# Patient Record
Sex: Female | Born: 1980 | Race: Black or African American | Hispanic: No | Marital: Married | Smoking: Current every day smoker
Health system: Southern US, Community
[De-identification: ages and names within clinical notes are randomized; demographics above are authoritative.]

## PROBLEM LIST (undated history)

## (undated) DIAGNOSIS — M797 Fibromyalgia: Secondary | ICD-10-CM

## (undated) DIAGNOSIS — M1990 Unspecified osteoarthritis, unspecified site: Secondary | ICD-10-CM

## (undated) DIAGNOSIS — N809 Endometriosis, unspecified: Secondary | ICD-10-CM

## (undated) DIAGNOSIS — F329 Major depressive disorder, single episode, unspecified: Secondary | ICD-10-CM

## (undated) DIAGNOSIS — M7071 Other bursitis of hip, right hip: Secondary | ICD-10-CM

## (undated) DIAGNOSIS — F4310 Post-traumatic stress disorder, unspecified: Secondary | ICD-10-CM

## (undated) DIAGNOSIS — I10 Essential (primary) hypertension: Secondary | ICD-10-CM

## (undated) DIAGNOSIS — J42 Unspecified chronic bronchitis: Secondary | ICD-10-CM

## (undated) DIAGNOSIS — M7072 Other bursitis of hip, left hip: Secondary | ICD-10-CM

## (undated) DIAGNOSIS — G5603 Carpal tunnel syndrome, bilateral upper limbs: Secondary | ICD-10-CM

## (undated) DIAGNOSIS — F32A Depression, unspecified: Secondary | ICD-10-CM

## (undated) HISTORY — DX: Major depressive disorder, single episode, unspecified: F32.9

## (undated) HISTORY — DX: Other bursitis of hip, right hip: M70.72

## (undated) HISTORY — DX: Other bursitis of hip, right hip: M70.71

## (undated) HISTORY — DX: Carpal tunnel syndrome, bilateral upper limbs: G56.03

## (undated) HISTORY — DX: Endometriosis, unspecified: N80.9

## (undated) HISTORY — DX: Depression, unspecified: F32.A

## (undated) HISTORY — DX: Unspecified chronic bronchitis: J42

## (undated) HISTORY — DX: Post-traumatic stress disorder, unspecified: F43.10

## (undated) HISTORY — DX: Unspecified osteoarthritis, unspecified site: M19.90

---

## 2004-06-28 ENCOUNTER — Emergency Department: Payer: Self-pay | Admitting: Emergency Medicine

## 2005-06-19 ENCOUNTER — Emergency Department: Payer: Self-pay | Admitting: Emergency Medicine

## 2005-06-20 ENCOUNTER — Emergency Department: Payer: Self-pay | Admitting: Emergency Medicine

## 2005-08-22 ENCOUNTER — Emergency Department: Payer: Self-pay | Admitting: Emergency Medicine

## 2005-09-10 ENCOUNTER — Other Ambulatory Visit: Payer: Self-pay

## 2005-09-10 ENCOUNTER — Emergency Department: Payer: Self-pay | Admitting: Unknown Physician Specialty

## 2006-01-19 ENCOUNTER — Emergency Department: Payer: Self-pay | Admitting: Emergency Medicine

## 2006-04-12 ENCOUNTER — Emergency Department: Payer: Self-pay

## 2006-05-16 ENCOUNTER — Emergency Department: Payer: Self-pay | Admitting: Emergency Medicine

## 2006-06-30 ENCOUNTER — Emergency Department: Payer: Self-pay | Admitting: Internal Medicine

## 2006-08-20 ENCOUNTER — Observation Stay: Payer: Self-pay | Admitting: Obstetrics and Gynecology

## 2006-08-30 ENCOUNTER — Emergency Department: Payer: Self-pay | Admitting: General Practice

## 2006-10-15 ENCOUNTER — Encounter: Payer: Self-pay | Admitting: Family Medicine

## 2006-10-20 ENCOUNTER — Observation Stay: Payer: Self-pay

## 2006-10-22 ENCOUNTER — Inpatient Hospital Stay: Payer: Self-pay

## 2006-10-29 ENCOUNTER — Inpatient Hospital Stay: Payer: Self-pay

## 2006-11-09 ENCOUNTER — Emergency Department: Payer: Self-pay | Admitting: General Practice

## 2006-11-11 ENCOUNTER — Encounter: Payer: Self-pay | Admitting: Family Medicine

## 2007-07-01 ENCOUNTER — Emergency Department: Payer: Self-pay | Admitting: Emergency Medicine

## 2007-08-18 ENCOUNTER — Emergency Department (HOSPITAL_COMMUNITY): Admission: EM | Admit: 2007-08-18 | Discharge: 2007-08-18 | Payer: Self-pay | Admitting: Emergency Medicine

## 2007-09-16 ENCOUNTER — Emergency Department: Payer: Self-pay | Admitting: Emergency Medicine

## 2007-10-09 ENCOUNTER — Emergency Department: Payer: Self-pay | Admitting: Internal Medicine

## 2008-01-17 ENCOUNTER — Emergency Department: Payer: Self-pay | Admitting: Emergency Medicine

## 2008-06-01 ENCOUNTER — Emergency Department (HOSPITAL_COMMUNITY): Admission: EM | Admit: 2008-06-01 | Discharge: 2008-06-01 | Payer: Self-pay | Admitting: Emergency Medicine

## 2008-12-05 ENCOUNTER — Emergency Department: Payer: Self-pay | Admitting: Emergency Medicine

## 2011-03-17 LAB — URINE MICROSCOPIC-ADD ON

## 2011-03-17 LAB — URINALYSIS, ROUTINE W REFLEX MICROSCOPIC
Bilirubin Urine: NEGATIVE
Nitrite: NEGATIVE
Specific Gravity, Urine: 1.015 (ref 1.005–1.030)
Urobilinogen, UA: 0.2 mg/dL (ref 0.0–1.0)
pH: 5.5 (ref 5.0–8.0)

## 2011-03-17 LAB — POCT PREGNANCY, URINE: Preg Test, Ur: NEGATIVE

## 2011-04-17 ENCOUNTER — Emergency Department: Payer: Self-pay | Admitting: Emergency Medicine

## 2011-05-13 ENCOUNTER — Emergency Department: Payer: Self-pay | Admitting: Emergency Medicine

## 2011-08-10 ENCOUNTER — Ambulatory Visit: Payer: Self-pay | Admitting: Family Medicine

## 2011-09-03 ENCOUNTER — Emergency Department: Payer: Self-pay | Admitting: Internal Medicine

## 2011-09-03 LAB — URINALYSIS, COMPLETE
Bilirubin,UR: NEGATIVE
Blood: NEGATIVE
Squamous Epithelial: 5

## 2011-09-03 LAB — COMPREHENSIVE METABOLIC PANEL
BUN: 8 mg/dL (ref 7–18)
Calcium, Total: 9 mg/dL (ref 8.5–10.1)
Chloride: 106 mmol/L (ref 98–107)
EGFR (African American): >60
Osmolality: 280 (ref 275–301)
Potassium: 3.7 mmol/L (ref 3.5–5.1)
SGOT(AST): 13 U/L — ABNORMAL LOW (ref 15–37)
SGPT (ALT): 17 U/L
Sodium: 142 mmol/L (ref 136–145)

## 2011-09-03 LAB — MAGNESIUM: Magnesium: 1.6 mg/dL — ABNORMAL LOW

## 2011-09-03 LAB — CBC
HGB: 13.8 g/dL (ref 12.0–16.0)
MCH: 31.6 pg (ref 26.0–34.0)
MCHC: 33.5 g/dL (ref 32.0–36.0)
MCV: 94 fL (ref 80–100)
Platelet: 183 10*3/uL (ref 150–440)
RDW: 14.8 % — ABNORMAL HIGH (ref 11.5–14.5)
WBC: 5.1 10*3/uL (ref 3.6–11.0)

## 2011-09-03 LAB — LIPASE, BLOOD: Lipase: 153 U/L (ref 73–393)

## 2011-09-09 LAB — CULTURE, BLOOD (SINGLE)

## 2011-11-09 ENCOUNTER — Emergency Department: Payer: Self-pay | Admitting: Emergency Medicine

## 2013-02-18 ENCOUNTER — Emergency Department: Payer: Self-pay | Admitting: Emergency Medicine

## 2013-06-22 ENCOUNTER — Emergency Department: Payer: Self-pay | Admitting: Emergency Medicine

## 2013-10-31 ENCOUNTER — Emergency Department: Payer: Self-pay | Admitting: Emergency Medicine

## 2013-11-02 LAB — BETA STREP CULTURE(ARMC)

## 2014-11-23 ENCOUNTER — Emergency Department
Admission: EM | Admit: 2014-11-23 | Discharge: 2014-11-23 | Disposition: A | Discharge status: Home / Self Care | Payer: Commercial Managed Care - PPO | Attending: Emergency Medicine | Admitting: Emergency Medicine

## 2014-11-23 ENCOUNTER — Encounter: Payer: Self-pay | Admitting: Emergency Medicine

## 2014-11-23 DIAGNOSIS — L03114 Cellulitis of left upper limb: Secondary | ICD-10-CM

## 2014-11-23 DIAGNOSIS — Z720 Tobacco use: Secondary | ICD-10-CM | POA: Diagnosis not present

## 2014-11-23 DIAGNOSIS — E876 Hypokalemia: Secondary | ICD-10-CM | POA: Diagnosis not present

## 2014-11-23 DIAGNOSIS — R2232 Localized swelling, mass and lump, left upper limb: Secondary | ICD-10-CM | POA: Diagnosis present

## 2014-11-23 DIAGNOSIS — I10 Essential (primary) hypertension: Secondary | ICD-10-CM | POA: Insufficient documentation

## 2014-11-23 HISTORY — DX: Essential (primary) hypertension: I10

## 2014-11-23 LAB — CBC WITH DIFFERENTIAL/PLATELET
BASOS ABS: 0 10*3/uL (ref 0–0.1)
Basophils Relative: 1 %
EOS PCT: 6 %
Eosinophils Absolute: 0.3 10*3/uL (ref 0–0.7)
HCT: 39.8 % (ref 35.0–47.0)
Hemoglobin: 13 g/dL (ref 12.0–16.0)
LYMPHS ABS: 2 10*3/uL (ref 1.0–3.6)
LYMPHS PCT: 34 %
MCH: 30.1 pg (ref 26.0–34.0)
MCHC: 32.6 g/dL (ref 32.0–36.0)
MCV: 92.2 fL (ref 80.0–100.0)
Monocytes Absolute: 0.5 10*3/uL (ref 0.2–0.9)
Monocytes Relative: 9 %
NEUTROS ABS: 3 10*3/uL (ref 1.4–6.5)
NEUTROS PCT: 50 %
PLATELETS: 212 10*3/uL (ref 150–440)
RBC: 4.32 MIL/uL (ref 3.80–5.20)
RDW: 15.4 % — AB (ref 11.5–14.5)
WBC: 5.9 10*3/uL (ref 3.6–11.0)

## 2014-11-23 LAB — BASIC METABOLIC PANEL
Anion gap: 6 (ref 5–15)
BUN: 10 mg/dL (ref 6–20)
CALCIUM: 8.9 mg/dL (ref 8.9–10.3)
CO2: 25 mmol/L (ref 22–32)
CREATININE: 1.06 mg/dL — AB (ref 0.44–1.00)
Chloride: 107 mmol/L (ref 101–111)
GFR calc Af Amer: >60 mL/min (ref >60)
GLUCOSE: 85 mg/dL (ref 65–99)
Potassium: 3.4 mmol/L — ABNORMAL LOW (ref 3.5–5.1)
SODIUM: 138 mmol/L (ref 135–145)

## 2014-11-23 MED ORDER — CLINDAMYCIN HCL 150 MG PO CAPS: ORAL_CAPSULE | ORAL | Status: DC

## 2014-11-23 MED ORDER — CLINDAMYCIN PHOSPHATE 600 MG/50ML IV SOLN
INTRAVENOUS | Status: AC
  Administered 2014-11-23: 600 mg via INTRAVENOUS
  Filled 2014-11-23: qty 50

## 2014-11-23 MED ORDER — EPINEPHRINE HCL 1 MG/ML IJ SOLN
INTRAMUSCULAR | Status: AC
  Filled 2014-11-23: qty 1

## 2014-11-23 MED ORDER — METHYLPREDNISOLONE SODIUM SUCC 125 MG IJ SOLR
INTRAMUSCULAR | Status: AC
  Filled 2014-11-23: qty 2

## 2014-11-23 MED ORDER — CLINDAMYCIN PHOSPHATE 600 MG/50ML IV SOLN
600.0000 mg | Freq: Once | INTRAVENOUS | Status: AC
  Administered 2014-11-23: 600 mg via INTRAVENOUS

## 2014-11-23 MED ORDER — HYDROCODONE-ACETAMINOPHEN 5-325 MG PO TABS: 1.0000 | ORAL_TABLET | ORAL | Status: DC | PRN

## 2014-11-23 NOTE — ED Notes (Signed)
itchy

## 2014-11-23 NOTE — ED Notes (Signed)
Has swelling to left humerus, has some insect bites, noticed yesterday

## 2014-11-23 NOTE — ED Provider Notes (Signed)
[facility] Emergency Department Provider Note  ____________________________________________  Time seen: 1528  I have reviewed the triage vital signs and the nursing notes.   HISTORY  Chief Complaint Arm Swelling   HPI Mrytle TAJ ARTEAGA is a 34 y.o. female is here today with complaint of left arm swelling. She is not aware of any bugs biting her but noted that there was some itching, swelling and tenderness to her left arm yesterday. The swelling and redness has increased today. She is unaware of any fever at home, positive for some nausea but no vomiting.   Past Medical History  Diagnosis Date  . Hypertension     There are no active problems to display for this patient.   Past Surgical History  Procedure Laterality Date  . Cesarean section      Current Outpatient Rx  Name  Route  Sig  Dispense  Refill  . clindamycin (CLEOCIN) 150 MG capsule      2 caps every 6 hours until finished   56 capsule   0   . HYDROcodone-acetaminophen (NORCO/VICODIN) 5-325 MG per tablet   Oral   Take 1 tablet by mouth every 4 (four) hours as needed for moderate pain.   20 tablet   0     Allergies Amoxicillin  No family history on file.  Social History History  Substance Use Topics  . Smoking status: Current Every Day Smoker -- 0.50 packs/day    Types: Cigarettes  . Smokeless tobacco: Not on file  . Alcohol Use: No    Review of Systems Constitutional: No fever/chills ENT: No sore throat. Cardiovascular: Denies chest pain. Respiratory: Denies shortness of breath. Gastrointestinal: No abdominal pain.  No nausea, no vomiting.   Genitourinary: Negative for dysuria. Musculoskeletal: Negative for back pain. Skin: Negative for rash. Neurological: Negative for headaches, focal weakness or numbness.  10-point ROS otherwise negative.  ____________________________________________   PHYSICAL EXAM:  VITAL SIGNS: ED Triage Vitals  Enc Vitals Group     BP  --      Pulse --      Resp --      Temp --      Temp src --      SpO2 --      Weight --      Height --      Head Cir --      Peak Flow --      Pain Score 11/23/14 1455 6     Pain Loc --      Pain Edu? --      Excl. in [place]? --     Constitutional: Alert and oriented. Well appearing and in no acute distress. Eyes: Conjunctivae are normal. PERRL. EOMI. Head: Atraumatic. Nose: No congestion/rhinnorhea. Neck: No stridor.   Cardiovascular: Normal rate, regular rhythm. Grossly normal heart sounds.  Good peripheral circulation. Respiratory: Normal respiratory effort.  No retractions. Lungs CTAB. Gastrointestinal: Soft and nontender. No distention. Musculoskeletal: No lower extremity tenderness nor edema.  No joint effusions. Range of motion left arm is without restrictions. Neurologic:  Normal speech and language. No gross focal neurologic deficits are appreciated. Speech is normal. No gait instability. Skin:  Skin is warm, dry and intact. Left arm lateral aspect with a 4-1/2-5 cm erythematous area with warmth approximately 2 cm surrounding this. It is tender to touch. There is no drainage from this area. Sooner this area is extremely hard with no fluctuance on palpation in the center. Psychiatric: Mood and affect are  normal. Speech and behavior are normal.  ____________________________________________   LABS (all labs ordered are listed, but only abnormal results are displayed)  Labs Reviewed  CBC WITH DIFFERENTIAL/PLATELET - Abnormal; Notable for the following:    RDW 15.4 (*)    All other components within normal limits  BASIC METABOLIC PANEL - Abnormal; Notable for the following:    Potassium 3.4 (*)    Creatinine, Ser 1.06 (*)    All other components within normal limits  PROCEDURES  Procedure(s) performed: None  Critical Care performed: No  ____________________________________________   INITIAL IMPRESSION / ASSESSMENT AND PLAN / ED COURSE  Pertinent labs & imaging  results that were available during my care of the patient were reviewed by me and considered in my medical decision making (see chart for details).  Patient is slightly hypokalemic. Patient states she's had a history of low potassium at approximately 8 years ago. She is currently a patient at [facility] and will follow up there. She was started on antibiotic's and told to return to the emergency room should this area become much worse, unable to take antibiotic's or high fever ____________________________________________   FINAL CLINICAL IMPRESSION(S) / ED DIAGNOSES  Final diagnoses:  Cellulitis of arm, left  Hypokalemia      Johnn Hai, PA-C 11/23/14 1803  Delman Kitten, MD 11/26/14 1550

## 2015-10-13 ENCOUNTER — Ambulatory Visit
Admission: RE | Admit: 2015-10-13 | Discharge: 2015-10-13 | Disposition: A | Discharge status: Home / Self Care | Payer: Commercial Managed Care - PPO | Source: Ambulatory Visit | Attending: Family Medicine | Admitting: Family Medicine

## 2015-10-13 ENCOUNTER — Other Ambulatory Visit: Payer: Self-pay | Admitting: Family Medicine

## 2015-10-13 DIAGNOSIS — M7918 Myalgia, other site: Secondary | ICD-10-CM

## 2015-10-13 DIAGNOSIS — M791 Myalgia: Secondary | ICD-10-CM | POA: Insufficient documentation

## 2018-06-22 ENCOUNTER — Emergency Department
Admission: EM | Admit: 2018-06-22 | Discharge: 2018-06-22 | Disposition: A | Discharge status: Home / Self Care | Payer: Commercial Managed Care - PPO | Attending: Emergency Medicine | Admitting: Emergency Medicine

## 2018-06-22 ENCOUNTER — Encounter: Payer: Self-pay | Admitting: Emergency Medicine

## 2018-06-22 ENCOUNTER — Emergency Department: Payer: Commercial Managed Care - PPO

## 2018-06-22 ENCOUNTER — Other Ambulatory Visit: Payer: Self-pay

## 2018-06-22 DIAGNOSIS — F17210 Nicotine dependence, cigarettes, uncomplicated: Secondary | ICD-10-CM | POA: Diagnosis not present

## 2018-06-22 DIAGNOSIS — M5442 Lumbago with sciatica, left side: Secondary | ICD-10-CM

## 2018-06-22 DIAGNOSIS — M79605 Pain in left leg: Secondary | ICD-10-CM | POA: Diagnosis present

## 2018-06-22 DIAGNOSIS — I10 Essential (primary) hypertension: Secondary | ICD-10-CM | POA: Diagnosis not present

## 2018-06-22 HISTORY — DX: Fibromyalgia: M79.7

## 2018-06-22 LAB — URINALYSIS, COMPLETE (UACMP) WITH MICROSCOPIC
Bilirubin Urine: NEGATIVE
Glucose, UA: NEGATIVE mg/dL
Ketones, ur: 5 mg/dL — AB
Leukocytes, UA: NEGATIVE
Nitrite: NEGATIVE
PH: 5 (ref 5.0–8.0)
Protein, ur: NEGATIVE mg/dL
Specific Gravity, Urine: 1.024 (ref 1.005–1.030)

## 2018-06-22 LAB — POCT PREGNANCY, URINE: Preg Test, Ur: NEGATIVE

## 2018-06-22 MED ORDER — HYDROCODONE-ACETAMINOPHEN 5-325 MG PO TABS: 1.0000 | ORAL_TABLET | Freq: Four times a day (QID) | ORAL | 0 refills | Status: DC | PRN

## 2018-06-22 MED ORDER — PREDNISONE 10 MG PO TABS: ORAL_TABLET | ORAL | 0 refills | Status: DC

## 2018-06-22 MED ORDER — KETOROLAC TROMETHAMINE 30 MG/ML IJ SOLN
30.0000 mg | Freq: Once | INTRAMUSCULAR | Status: AC
  Administered 2018-06-22: 30 mg via INTRAMUSCULAR
  Filled 2018-06-22: qty 1

## 2018-06-22 NOTE — Discharge Instructions (Addendum)
Follow-up with your primary care provider if any continued problems.  You may use ice or heat to your back as needed for discomfort.  Begin taking prednisone today and taper down each day as directed on the label.  Also Norco 1 every 6 hours as needed for pain.  Do not drive or operate machinery while taking this medication.  Continue taking your muscle relaxant that you have at home.

## 2018-06-22 NOTE — ED Notes (Signed)
Pt to ed with c/o left knee and left hip pain and swelling.  Pt states this is a chronic problem and has had steroid shots which helped only briefly.  Pt states pain and swelling started 4 days ago.  Denies injury.

## 2018-06-22 NOTE — ED Triage Notes (Addendum)
C/O left knee pain and swelling x 4 days.  Also c/o left hip pain.  Describes pain as dull/ burning/ shooting pain.  States pain shoots down entire leg to ankle.   Denies injury.  Has had similar pain in the past and was told she had bursitis.

## 2018-06-22 NOTE — ED Provider Notes (Signed)
[facility] Emergency Department Provider Note  ____________________________________________   None    (approximate)  I have reviewed the triage vital signs and the nursing notes.   HISTORY  Chief Complaint Knee Pain   HPI Tasha Austin is a 38 y.o. female presents to the ED with complaint of left sided back pain which radiates down her left leg.  She states that the pain is a dull, burning, shooting at times.  She states that there are times where the pain goes just into her thigh and other times where it goes all the way down to her toes.  Denies any injury recently to her back.  She has had similar symptoms in the past and was told that she had bursitis.  She denies any urinary symptoms, saddle anesthesias or incontinence of bowel or bladder.  Currently she rates her pain as a 9/10.   Past Medical History:  Diagnosis Date  . Fibromyalgia   . Hypertension     There are no active problems to display for this patient.   Past Surgical History:  Procedure Laterality Date  . CESAREAN SECTION      Prior to Admission medications   Medication Sig Start Date End Date Taking? Authorizing Provider  HYDROcodone-acetaminophen (NORCO/VICODIN) 5-325 MG tablet Take 1 tablet by mouth every 6 (six) hours as needed for moderate pain. 06/22/18   Johnn Hai, PA-C  predniSONE (DELTASONE) 10 MG tablet Take 6 tablets  today, on day 2 take 5 tablets, day 3 take 4 tablets, day 4 take 3 tablets, day 5 take  2 tablets and 1 tablet the last day 06/22/18   Johnn Hai, PA-C    Allergies Amoxicillin  No family history on file.  Social History Social History   Tobacco Use  . Smoking status: Current Every Day Smoker    Packs/day: 0.50    Types: Cigarettes  . Smokeless tobacco: Never Used  Substance Use Topics  . Alcohol use: No  . Drug use: Not on file    Review of Systems Constitutional: No fever/chills Cardiovascular: Denies chest  pain. Respiratory: Denies shortness of breath. Gastrointestinal: No abdominal pain.  No nausea, no vomiting.   Genitourinary: Negative for dysuria. Musculoskeletal: Positive low back pain with left leg radiculopathy. Skin: Negative for rash. Neurological: Negative for  focal weakness or numbness. ____________________________________________   PHYSICAL EXAM:  VITAL SIGNS: ED Triage Vitals  Enc Vitals Group     BP 06/22/18 0836 116/88     Pulse Rate 06/22/18 0836 93     Resp 06/22/18 0836 16     Temp 06/22/18 0836 98.1 F (36.7 C)     Temp Source 06/22/18 0836 Oral     SpO2 06/22/18 0836 97 %     Weight 06/22/18 0834 218 lb (98.9 kg)     Height 06/22/18 0834 5\' 2"  (1.575 m)     Head Circumference --      Peak Flow --      Pain Score 06/22/18 0833 9     Pain Loc --      Pain Edu? --      Excl. in [place]? --     Constitutional: Alert and oriented. Well appearing and in no acute distress. Eyes: Conjunctivae are normal.  Head: Atraumatic. Neck: No stridor.   Cardiovascular: Normal rate, regular rhythm. Grossly normal heart sounds.  Good peripheral circulation. Respiratory: Normal respiratory effort.  No retractions. Lungs CTAB. Gastrointestinal: Soft and nontender. No distention.  No CVA tenderness. Musculoskeletal: On examination of the back there is no gross deformity however there is moderate tenderness on palpation of the left SI joint area and surrounding tissue.  There was some minimal point tenderness on palpation of the lower lumbar spine and left paravertebral muscles.  Straight leg raises were negative.  Good muscle strength bilaterally. Neurologic:  Normal speech and language. No gross focal neurologic deficits are appreciated.  Reflexes are 2+ bilaterally.  No gait instability. Skin:  Skin is warm, dry and intact. No rash noted. Psychiatric: Mood and affect are normal. Speech and behavior are normal.  ____________________________________________   LABS (all labs  ordered are listed, but only abnormal results are displayed)  Labs Reviewed  URINALYSIS, COMPLETE (UACMP) WITH MICROSCOPIC - Abnormal; Notable for the following components:      Result Value   Color, Urine AMBER (*)    APPearance CLOUDY (*)    Hgb urine dipstick SMALL (*)    Ketones, ur 5 (*)    Bacteria, UA RARE (*)    All other components within normal limits  POC URINE PREG, ED  POCT PREGNANCY, URINE    RADIOLOGY   Official radiology report(s): Dg Lumbar Spine 2-3 Views  Result Date: 06/22/2018 CLINICAL DATA:  Patient with low back pain radiating into the left hip and knee. EXAM: LUMBAR SPINE - 2-3 VIEW COMPARISON:  Lumbar spine radiograph 10/13/2015 FINDINGS: Normal anatomic alignment. No evidence for acute fracture or dislocation. Preservation of the vertebral body and intervertebral disc space heights. SI joints unremarkable. Pelvic phleboliths. IMPRESSION: No acute osseous abnormality. Electronically Signed   By: Lovey Newcomer M.D.   On: 06/22/2018 10:10    ____________________________________________   PROCEDURES  Procedure(s) performed: None  Procedures  Critical Care performed: No  ____________________________________________   INITIAL IMPRESSION / ASSESSMENT AND PLAN / ED COURSE  As part of my medical decision making, I reviewed the following data within the [place] , Notes from prior ED visits and [place] Controlled Substance Database  Patient presents to the ED with complaint of left lower back pain with radiation into her left leg.  She is been experiencing this off and on but today is much worse.  She has had similar episodes in the past and was told that she had bursitis.  On exam today she is moderately tender over the left SI joint area.  Neurologically she is intact.  Urinalysis was checked just to make sure and was reassuring that she did not have a UTI.  X-rays showed no acute bony changes.  Patient was reassured.  She was given an injection  of Toradol 30 mg IM, and discharged on Prednisone taper and norco as needed for pain.  Patient is to follow-up with her PCP if any continued problems.  She will also continue taking her muscle relaxant that she has at home.  ____________________________________________   FINAL CLINICAL IMPRESSION(S) / ED DIAGNOSES  Final diagnoses:  Acute left-sided low back pain with left-sided sciatica     ED Discharge Orders         Ordered    predniSONE (DELTASONE) 10 MG tablet     06/22/18 1116    HYDROcodone-acetaminophen (NORCO/VICODIN) 5-325 MG tablet  Every 6 hours PRN     06/22/18 1116           Note:  This document was prepared using Dragon voice recognition software and may include unintentional dictation errors.    Johnn Hai, PA-C 06/22/18 1610  Schuyler Amor, MD 06/23/18 1434

## 2018-08-13 ENCOUNTER — Encounter: Payer: Self-pay | Admitting: Oncology

## 2018-08-13 ENCOUNTER — Encounter (INDEPENDENT_AMBULATORY_CARE_PROVIDER_SITE_OTHER): Payer: Self-pay

## 2018-08-13 ENCOUNTER — Other Ambulatory Visit: Payer: Self-pay

## 2018-08-13 ENCOUNTER — Inpatient Hospital Stay: Payer: Commercial Managed Care - PPO | Attending: Oncology | Admitting: Oncology

## 2018-08-13 ENCOUNTER — Telehealth: Payer: Self-pay

## 2018-08-13 ENCOUNTER — Inpatient Hospital Stay: Payer: Commercial Managed Care - PPO

## 2018-08-13 VITALS — BP 133/95 | HR 97 | Temp 97.6°F | Ht 62.0 in | Wt 241.0 lb

## 2018-08-13 DIAGNOSIS — D1803 Hemangioma of intra-abdominal structures: Secondary | ICD-10-CM | POA: Insufficient documentation

## 2018-08-13 DIAGNOSIS — R5383 Other fatigue: Secondary | ICD-10-CM | POA: Insufficient documentation

## 2018-08-13 DIAGNOSIS — R740 Nonspecific elevation of levels of transaminase and lactic acid dehydrogenase [LDH]: Secondary | ICD-10-CM | POA: Insufficient documentation

## 2018-08-13 DIAGNOSIS — K769 Liver disease, unspecified: Secondary | ICD-10-CM

## 2018-08-13 DIAGNOSIS — D649 Anemia, unspecified: Secondary | ICD-10-CM | POA: Insufficient documentation

## 2018-08-13 DIAGNOSIS — Z720 Tobacco use: Secondary | ICD-10-CM | POA: Diagnosis not present

## 2018-08-13 DIAGNOSIS — I10 Essential (primary) hypertension: Secondary | ICD-10-CM | POA: Diagnosis not present

## 2018-08-13 LAB — TSH: TSH: 2.777 u[IU]/mL (ref 0.350–4.500)

## 2018-08-13 LAB — CBC WITH DIFFERENTIAL/PLATELET
Abs Immature Granulocytes: 0.01 10*3/uL (ref 0.00–0.07)
BASOS ABS: 0.1 10*3/uL (ref 0.0–0.1)
Basophils Relative: 1 %
Eosinophils Absolute: 0.4 10*3/uL (ref 0.0–0.5)
Eosinophils Relative: 6 %
HCT: 34.7 % — ABNORMAL LOW (ref 36.0–46.0)
Hemoglobin: 11.7 g/dL — ABNORMAL LOW (ref 12.0–15.0)
Immature Granulocytes: 0 %
Lymphocytes Relative: 35 %
Lymphs Abs: 2.2 10*3/uL (ref 0.7–4.0)
MCH: 29.7 pg (ref 26.0–34.0)
MCHC: 33.7 g/dL (ref 30.0–36.0)
MCV: 88.1 fL (ref 80.0–100.0)
Monocytes Absolute: 0.5 10*3/uL (ref 0.1–1.0)
Monocytes Relative: 8 %
NRBC: 0 % (ref 0.0–0.2)
Neutro Abs: 3.2 10*3/uL (ref 1.7–7.7)
Neutrophils Relative %: 50 %
Platelets: 251 10*3/uL (ref 150–400)
RBC: 3.94 MIL/uL (ref 3.87–5.11)
RDW: 16.5 % — AB (ref 11.5–15.5)
WBC: 6.2 10*3/uL (ref 4.0–10.5)

## 2018-08-13 LAB — COMPREHENSIVE METABOLIC PANEL
ALT: 53 U/L — ABNORMAL HIGH (ref 0–44)
AST: 21 U/L (ref 15–41)
Albumin: 4.3 g/dL (ref 3.5–5.0)
Alkaline Phosphatase: 70 U/L (ref 38–126)
Anion gap: 6 (ref 5–15)
BUN: 9 mg/dL (ref 6–20)
CO2: 26 mmol/L (ref 22–32)
Calcium: 8.8 mg/dL — ABNORMAL LOW (ref 8.9–10.3)
Chloride: 106 mmol/L (ref 98–111)
Creatinine, Ser: 0.92 mg/dL (ref 0.44–1.00)
GFR calc Af Amer: >60 mL/min (ref >60)
GFR calc non Af Amer: >60 mL/min (ref >60)
Glucose, Bld: 90 mg/dL (ref 70–99)
POTASSIUM: 3.9 mmol/L (ref 3.5–5.1)
SODIUM: 138 mmol/L (ref 135–145)
Total Bilirubin: 0.4 mg/dL (ref 0.3–1.2)
Total Protein: 7.4 g/dL (ref 6.5–8.1)

## 2018-08-13 NOTE — Progress Notes (Signed)
Met with Mrs. Roorda and her spouse before and during consult with Dr. Tasia Catchings. Introduced nurse navigator services and provided contact information for future needs. US liver has been scheduled.

## 2018-08-13 NOTE — Telephone Encounter (Signed)
Call placed to [facility] to obtain 08/05/18 MRI lumbar report. Report obtained. Awaiting call back to have images powershared.

## 2018-08-13 NOTE — Progress Notes (Signed)
Hematology/Oncology Consult note [facility] [phone] [phone]   Patient Care Team: [facility] as PCP - General (General Practice) Clent Jacks, RN as Registered Nurse  REFERRING PROVIDER: Dwan Bolt CHIEF COMPLAINTS/REASON FOR VISIT:  Evaluation of liver disease  HISTORY OF PRESENTING ILLNESS:  Tasha Austin is a  38 y.o.  female with PMH listed below who was referred to me for evaluation of liver disease.  Patient was recently seen at [facility] for evaluation of chronic lower back pain/cervival and lumbar radiculopathy, multiple joint pain. Had MRI spine done for evaluation. MRI 08/05/2018 showed L3-L4 disc bulge, small right center/subarticular disc protrusion, facet arthrosis, L5 left nerve root recess, L5-S1 facet arthrosis.  T2 hyperintense lesion within the liver.  Patient was referred to oncology for further evaluation.  Other than multiple joint pain, back pain, she also reports chronic fatigue.  Denies weight loss, fever, chills, fatigue, night sweats. She has weight gain.  She drinks wine every 1-2 weeks. History of heavy ETOH use in her 52s.  No history of transfusion, history of hepatitis.  Current every day smoker.   Review of Systems  Constitutional: Positive for fatigue. Negative for appetite change, chills and fever.  HENT:   Negative for hearing loss and voice change.   Eyes: Negative for eye problems.  Respiratory: Negative for chest tightness and cough.   Cardiovascular: Negative for chest pain.  Gastrointestinal: Negative for abdominal distention, abdominal pain and blood in stool.  Endocrine: Negative for hot flashes.  Genitourinary: Negative for difficulty urinating and frequency.   Musculoskeletal: Positive for arthralgias and back pain.  Skin: Negative for itching and rash.  Neurological: Negative for extremity weakness.  Hematological: Negative for adenopathy.    Psychiatric/Behavioral: Negative for confusion.    MEDICAL HISTORY:  Past Medical History:  Diagnosis Date  . Arthritis   . Bursitis of both hips   . Carpal tunnel syndrome on both sides   . Chronic bronchitis ([place])   . Depression   . Endometriosis 2002-2003  . Fibromyalgia   . Hypertension   . PTSD (post-traumatic stress disorder)     SURGICAL HISTORY: Past Surgical History:  Procedure Laterality Date  . CESAREAN SECTION      SOCIAL HISTORY: Social History   Socioeconomic History  . Marital status: Married    Spouse name: Not on file  . Number of children: Not on file  . Years of education: Not on file  . Highest education level: Not on file  Occupational History  . Not on file  Social Needs  . Financial resource strain: Not on file  . Food insecurity:    Worry: Not on file    Inability: Not on file  . Transportation needs:    Medical: Not on file    Non-medical: Not on file  Tobacco Use  . Smoking status: Current Every Day Smoker    Packs/day: 0.50    Years: 18.00    Pack years: 9.00    Types: Cigarettes  . Smokeless tobacco: Never Used  Substance and Sexual Activity  . Alcohol use: Yes    Alcohol/week: 1.0 standard drinks    Types: 1 Glasses of wine per week  . Drug use: Yes    Types: Marijuana  . Sexual activity: Not on file  Lifestyle  . Physical activity:    Days per week: Not on file    Minutes per session: Not on file  . Stress: Not on file  Relationships  . Social connections:    Talks on phone: Not on file    Gets together: Not on file    Attends religious service: Not on file    Active member of club or organization: Not on file    Attends meetings of clubs or organizations: Not on file    Relationship status: Not on file  . Intimate partner violence:    Fear of current or ex partner: Not on file    Emotionally abused: Not on file    Physically abused: Not on file    Forced sexual activity: Not on file  Other Topics Concern  .  Not on file  Social History Narrative  . Not on file    FAMILY HISTORY: Family History  Problem Relation Age of Onset  . Stroke Mother   . Clotting disorder Mother   . Depression Mother   . Heart disease Mother   . Bipolar disorder Brother   . Breast cancer Maternal Aunt   . Kidney cancer Maternal Uncle   . Clotting disorder Maternal Grandmother   . Lung cancer Maternal Grandfather   . Depression Maternal Aunt   . Dementia Maternal Aunt   . Narcolepsy Maternal Uncle     ALLERGIES:  is allergic to amoxicillin; prednisone; and shellfish allergy.  MEDICATIONS:  Current Outpatient Medications  Medication Sig Dispense Refill  . amitriptyline (ELAVIL) 25 MG tablet Take 25 mg by mouth at bedtime.    . Biotin 5000 MCG CAPS Take by mouth.    . cetirizine (ZYRTEC) 10 MG tablet Take 10 mg by mouth daily.    . CYCLOBENZAPRINE HCL PO Take 10 mg by mouth daily.    . CYMBALTA 60 MG capsule Take 60 mg by mouth at bedtime.    . diazepam (VALIUM) 2 MG tablet Take 2 mg by mouth 2 (two) times daily.    . Ferrous Gluconate (IRON 27 PO) Take by mouth.    Marland Kitchen FLUoxetine (PROZAC) 20 MG capsule Take 20 mg by mouth daily.    . Multiple Vitamins-Minerals (MULTIVITAMIN GUMMIES ADULT PO) Take by mouth.    . naproxen (NAPROSYN) 500 MG tablet Take 500 mg by mouth 2 (two) times daily.    Marland Kitchen omeprazole (PRILOSEC) 20 MG capsule Take 20 mg by mouth daily.    Marland Kitchen PROAIR HFA 108 (90 Base) MCG/ACT inhaler     . Probiotic Product ([place]) CAPS Take by mouth.    . traZODone (DESYREL) 50 MG tablet Take 50 mg by mouth at bedtime.     No current facility-administered medications for this visit.      PHYSICAL EXAMINATION: ECOG PERFORMANCE STATUS: 1 - Symptomatic but completely ambulatory Vitals:   08/13/18 1512  BP: (!) 133/95  Pulse: 97  Temp: 97.6 F (36.4 C)   Filed Weights   08/13/18 1512  Weight: 241 lb (109.3 kg)    Physical Exam Constitutional:      General: She is not in acute  distress.    Appearance: She is obese.  HENT:     Head: Normocephalic and atraumatic.  Eyes:     General: No scleral icterus.    Pupils: Pupils are equal, round, and reactive to light.  Neck:     Musculoskeletal: Normal range of motion and neck supple.  Cardiovascular:     Rate and Rhythm: Normal rate and regular rhythm.     Heart sounds: Normal heart sounds.  Pulmonary:     Effort: Pulmonary effort is normal.  No respiratory distress.     Breath sounds: No wheezing.  Abdominal:     General: Bowel sounds are normal. There is no distension.     Palpations: Abdomen is soft. There is no mass.     Tenderness: There is no abdominal tenderness.     Comments: Left upper quadrant tenderness with palpation.   Musculoskeletal: Normal range of motion.        General: No deformity.  Skin:    General: Skin is warm and dry.     Findings: No erythema or rash.  Neurological:     Mental Status: She is alert and oriented to person, place, and time.     Cranial Nerves: No cranial nerve deficit.     Coordination: Coordination normal.  Psychiatric:        Behavior: Behavior normal.        Thought Content: Thought content normal.     RADIOGRAPHIC STUDIES: I have personally reviewed the radiological images as listed and agreed with the findings in the report.  CMP Latest Ref Rng & Units 08/13/2018  Glucose 70 - 99 mg/dL 90  BUN 6 - 20 mg/dL 9  Creatinine 0.44 - 1.00 mg/dL 0.92  Sodium 135 - 145 mmol/L 138  Potassium 3.5 - 5.1 mmol/L 3.9  Chloride 98 - 111 mmol/L 106  CO2 22 - 32 mmol/L 26  Calcium 8.9 - 10.3 mg/dL 8.8(L)  Total Protein 6.5 - 8.1 g/dL 7.4  Total Bilirubin 0.3 - 1.2 mg/dL 0.4  Alkaline Phos 38 - 126 U/L 70  AST 15 - 41 U/L 21  ALT 0 - 44 U/L 53(H)   CBC Latest Ref Rng & Units 08/13/2018  WBC 4.0 - 10.5 K/uL 6.2  Hemoglobin 12.0 - 15.0 g/dL 11.7(L)  Hematocrit 36.0 - 46.0 % 34.7(L)  Platelets 150 - 400 K/uL 251    LABORATORY DATA:  I have reviewed the data as  listed Lab Results  Component Value Date   WBC 6.2 08/13/2018   HGB 11.7 (L) 08/13/2018   HCT 34.7 (L) 08/13/2018   MCV 88.1 08/13/2018   PLT 251 08/13/2018   Recent Labs    08/13/18 1551  NA 138  K 3.9  CL 106  CO2 26  GLUCOSE 90  BUN 9  CREATININE 0.92  CALCIUM 8.8*  GFRNONAA >60  GFRAA >60  PROT 7.4  ALBUMIN 4.3  AST 21  ALT 53*  ALKPHOS 70  BILITOT 0.4   Iron/TIBC/Ferritin/ %Sat No results found for: IRON, TIBC, FERRITIN, IRONPCTSAT      ASSESSMENT & PLAN:  1. Liver lesion   2. Fatigue, unspecified type   3. Tobacco abuse    Reviewed MRI spine results and discussed with patient.  Image is not available to me as it was done at outside facility.  Will obtain US abdomen for further evaluation. May need to obtain MRI liver as well, depending on the findings on [place].  Obtain baseline cbc, cmp, hepatitis panel,   Fatigue, check TSH # Smoking cessation was discussed with patient   Orders Placed This Encounter  Procedures  . US Abdomen Complete    Standing Status:   Future    Standing Expiration Date:   08/13/2019    Order Specific Question:   Reason for Exam (SYMPTOM  OR DIAGNOSIS REQUIRED)    Answer:   liver lesion on recent MRI spine done at [facility].    Order Specific Question:   Preferred imaging location?    Answer:  [facility]  . CBC with Differential/Platelet    Standing Status:   Future    Number of Occurrences:   1    Standing Expiration Date:   08/13/2019  . Comprehensive metabolic panel    Standing Status:   Future    Number of Occurrences:   1    Standing Expiration Date:   08/13/2019  . Hepatitis B surface antigen    Standing Status:   Future    Number of Occurrences:   1    Standing Expiration Date:   08/13/2019  . Hepatitis C antibody    Standing Status:   Future    Number of Occurrences:   1    Standing Expiration Date:   08/13/2019  . TSH    Standing Status:   Future    Number of Occurrences:   1    Standing Expiration Date:    09/13/2018    All questions were answered. The patient knows to call the clinic with any problems questions or concerns.  Return of visit: to be determined.  Thank you for this kind referral and the opportunity to participate in the care of this patient. A copy of today's note is routed to referring provider  Total face to face encounter time for this patient visit was 26min. >50% of the time was  spent in counseling and coordination of care.    Earlie Server, MD, PhD Hematology Oncology [facility] Pager- [phone] 08/13/2018

## 2018-08-13 NOTE — Progress Notes (Signed)
Patient here today as a new patient  

## 2018-08-14 LAB — HEPATITIS C ANTIBODY: HCV Ab: <0.1 s/co ratio (ref 0.0–0.9)

## 2018-08-14 LAB — HEPATITIS B SURFACE ANTIGEN: Hepatitis B Surface Ag: NEGATIVE

## 2018-08-16 ENCOUNTER — Other Ambulatory Visit: Payer: Self-pay

## 2018-08-16 ENCOUNTER — Ambulatory Visit
Admission: RE | Admit: 2018-08-16 | Discharge: 2018-08-16 | Disposition: A | Discharge status: Home / Self Care | Payer: Commercial Managed Care - PPO | Source: Ambulatory Visit | Attending: Oncology | Admitting: Oncology

## 2018-08-16 DIAGNOSIS — K769 Liver disease, unspecified: Secondary | ICD-10-CM | POA: Diagnosis present

## 2018-08-18 ENCOUNTER — Other Ambulatory Visit: Payer: Self-pay | Admitting: Oncology

## 2018-08-20 ENCOUNTER — Other Ambulatory Visit: Payer: Self-pay | Admitting: Family Medicine

## 2018-08-27 ENCOUNTER — Other Ambulatory Visit: Payer: Self-pay | Admitting: Oncology

## 2018-08-27 DIAGNOSIS — K769 Liver disease, unspecified: Secondary | ICD-10-CM

## 2018-08-29 ENCOUNTER — Telehealth: Payer: Self-pay | Admitting: *Deleted

## 2018-08-29 NOTE — Telephone Encounter (Signed)
Notified Barbaraann Rondo.  Ellison Hughs will you keep a look out for the new MRI date and also r/s pt to see Dr Tasia Catchings 1-2 days after MRI

## 2018-08-29 NOTE — Telephone Encounter (Signed)
MRI called asking iof this patient MRI can be postponed for a month, or does she need to go ahead and have the scan as scheduled? Please advise

## 2018-08-29 NOTE — Telephone Encounter (Signed)
Per Dr Tasia Catchings, okay to move MRI out approx 4 weeks.

## 2018-09-03 ENCOUNTER — Ambulatory Visit: Payer: Commercial Managed Care - PPO

## 2018-09-06 ENCOUNTER — Ambulatory Visit: Payer: Commercial Managed Care - PPO

## 2018-09-06 ENCOUNTER — Other Ambulatory Visit: Payer: Self-pay

## 2018-09-06 ENCOUNTER — Ambulatory Visit
Admission: RE | Admit: 2018-09-06 | Discharge: 2018-09-06 | Disposition: A | Discharge status: Home / Self Care | Payer: Commercial Managed Care - PPO | Source: Ambulatory Visit | Attending: Oncology | Admitting: Oncology

## 2018-09-06 DIAGNOSIS — K769 Liver disease, unspecified: Secondary | ICD-10-CM

## 2018-09-06 MED ORDER — GADOBUTROL 1 MMOL/ML IV SOLN
10.0000 mL | Freq: Once | INTRAVENOUS | Status: AC | PRN
  Administered 2018-09-06: 10 mL via INTRAVENOUS

## 2018-09-09 ENCOUNTER — Other Ambulatory Visit: Payer: Self-pay

## 2018-09-10 ENCOUNTER — Inpatient Hospital Stay: Payer: Commercial Managed Care - PPO | Admitting: Oncology

## 2018-09-10 ENCOUNTER — Encounter: Payer: Self-pay | Admitting: Oncology

## 2018-09-10 ENCOUNTER — Inpatient Hospital Stay (HOSPITAL_BASED_OUTPATIENT_CLINIC_OR_DEPARTMENT_OTHER): Payer: Commercial Managed Care - PPO | Admitting: Oncology

## 2018-09-10 DIAGNOSIS — D649 Anemia, unspecified: Secondary | ICD-10-CM | POA: Diagnosis not present

## 2018-09-10 DIAGNOSIS — R740 Nonspecific elevation of levels of transaminase and lactic acid dehydrogenase [LDH]: Secondary | ICD-10-CM

## 2018-09-10 DIAGNOSIS — R5383 Other fatigue: Secondary | ICD-10-CM | POA: Diagnosis not present

## 2018-09-10 DIAGNOSIS — D1803 Hemangioma of intra-abdominal structures: Secondary | ICD-10-CM | POA: Diagnosis not present

## 2018-09-10 DIAGNOSIS — R7401 Elevation of levels of liver transaminase levels: Secondary | ICD-10-CM

## 2018-09-10 NOTE — Progress Notes (Signed)
Virtual Visit via Telephone Note  I connected with Tasha Austin on 09/10/18 at 10:00 AM EDT by telephone and verified that I am speaking with the correct person using two identifiers.   I discussed the limitations, risks, security and privacy concerns of performing an evaluation and management service by telephone and the availability of in person appointments. I also discussed with the patient that there may be a patient responsible charge related to this service. The patient expressed understanding and agreed to proceed.  Location of patient: home Location of provider: work  INTERVAL HISTORY Tasha Austin is a 38 y.o. female who has above history reviewed by me today presents for follow up visit for management of liver lesions. Problems and complaints are listed below:  During interval patient has had work-up done including labs, ultrasound abdomen and MRI. This telephone visit is to discuss lab and imaging results. Patient reports feeling well today.  She has been recently diagnosed with carpal tunnel syndrome.  No new complaints today. Denies any shortness of breath, chest pain, abdominal pain, diarrhea.  Review of Systems  Constitutional: Negative for appetite change, chills, fatigue and fever.  HENT:   Negative for hearing loss and voice change.   Eyes: Negative for eye problems.  Respiratory: Negative for chest tightness and cough.   Cardiovascular: Negative for chest pain.  Gastrointestinal: Negative for abdominal distention, abdominal pain and blood in stool.  Endocrine: Negative for hot flashes.  Genitourinary: Negative for difficulty urinating and frequency.   Musculoskeletal: Positive for back pain. Negative for arthralgias.  Skin: Negative for itching and rash.  Neurological: Negative for extremity weakness.  Hematological: Negative for adenopathy.  Psychiatric/Behavioral: Negative for confusion.    Past Medical History:  Diagnosis Date  . Arthritis   . Bursitis  of both hips   . Carpal tunnel syndrome on both sides   . Chronic bronchitis ([place])   . Depression   . Endometriosis 2002-2003  . Fibromyalgia   . Hypertension   . PTSD (post-traumatic stress disorder)    Past Surgical History:  Procedure Laterality Date  . CESAREAN SECTION      Family History  Problem Relation Age of Onset  . Stroke Mother   . Clotting disorder Mother   . Depression Mother   . Heart disease Mother   . Bipolar disorder Brother   . Breast cancer Maternal Aunt   . Kidney cancer Maternal Uncle   . Clotting disorder Maternal Grandmother   . Lung cancer Maternal Grandfather   . Depression Maternal Aunt   . Dementia Maternal Aunt   . Narcolepsy Maternal Uncle     Social History   Socioeconomic History  . Marital status: Married    Spouse name: Not on file  . Number of children: Not on file  . Years of education: Not on file  . Highest education level: Not on file  Occupational History  . Not on file  Social Needs  . Financial resource strain: Not on file  . Food insecurity:    Worry: Not on file    Inability: Not on file  . Transportation needs:    Medical: Not on file    Non-medical: Not on file  Tobacco Use  . Smoking status: Current Every Day Smoker    Packs/day: 0.50    Years: 18.00    Pack years: 9.00    Types: Cigarettes  . Smokeless tobacco: Never Used  Substance and Sexual Activity  . Alcohol use: Yes  Alcohol/week: 1.0 standard drinks    Types: 1 Glasses of wine per week  . Drug use: Yes    Types: Marijuana  . Sexual activity: Not on file  Lifestyle  . Physical activity:    Days per week: Not on file    Minutes per session: Not on file  . Stress: Not on file  Relationships  . Social connections:    Talks on phone: Not on file    Gets together: Not on file    Attends religious service: Not on file    Active member of club or organization: Not on file    Attends meetings of clubs or organizations: Not on file    Relationship  status: Not on file  . Intimate partner violence:    Fear of current or ex partner: Not on file    Emotionally abused: Not on file    Physically abused: Not on file    Forced sexual activity: Not on file  Other Topics Concern  . Not on file  Social History Narrative  . Not on file    Current Outpatient Medications on File Prior to Visit  Medication Sig Dispense Refill  . amitriptyline (ELAVIL) 25 MG tablet Take 25 mg by mouth at bedtime.    . Biotin 5000 MCG CAPS Take by mouth.    . cetirizine (ZYRTEC) 10 MG tablet Take 10 mg by mouth daily.    . CYCLOBENZAPRINE HCL PO Take 10 mg by mouth daily.    . CYMBALTA 60 MG capsule Take 60 mg by mouth at bedtime.    . diazepam (VALIUM) 2 MG tablet Take 2 mg by mouth 2 (two) times daily.    . Ferrous Gluconate (IRON 27 PO) Take by mouth.    Marland Kitchen FLUoxetine (PROZAC) 20 MG capsule Take 20 mg by mouth daily.    Marland Kitchen omeprazole (PRILOSEC) 20 MG capsule Take 20 mg by mouth daily.    Marland Kitchen PROAIR HFA 108 (90 Base) MCG/ACT inhaler     . traZODone (DESYREL) 50 MG tablet Take 50 mg by mouth at bedtime.     No current facility-administered medications on file prior to visit.     Allergies  Allergen Reactions  . Amoxicillin Other (See Comments)    Yeast infections Yeast infection  . Prednisone   . Shellfish Allergy        Observations/Objective: Today's Vitals   09/10/18 0951  PainSc: 7    There is no height or weight on file to calculate BMI.   CBC    Component Value Date/Time   WBC 6.2 08/13/2018 1551   RBC 3.94 08/13/2018 1551   HGB 11.7 (L) 08/13/2018 1551   HGB 13.8 09/03/2011 1440   HCT 34.7 (L) 08/13/2018 1551   HCT 41.3 09/03/2011 1440   PLT 251 08/13/2018 1551   PLT 183 09/03/2011 1440   MCV 88.1 08/13/2018 1551   MCV 94 09/03/2011 1440   MCH 29.7 08/13/2018 1551   MCHC 33.7 08/13/2018 1551   RDW 16.5 (H) 08/13/2018 1551   RDW 14.8 (H) 09/03/2011 1440   LYMPHSABS 2.2 08/13/2018 1551   MONOABS 0.5 08/13/2018 1551   EOSABS 0.4  08/13/2018 1551   BASOSABS 0.1 08/13/2018 1551    CMP     Component Value Date/Time   NA 138 08/13/2018 1551   NA 142 09/03/2011 1440   K 3.9 08/13/2018 1551   K 3.7 09/03/2011 1440   CL 106 08/13/2018 1551   CL 106 09/03/2011 1440  CO2 26 08/13/2018 1551   CO2 25 09/03/2011 1440   GLUCOSE 90 08/13/2018 1551   GLUCOSE 75 09/03/2011 1440   BUN 9 08/13/2018 1551   BUN 8 09/03/2011 1440   CREATININE 0.92 08/13/2018 1551   CREATININE 0.91 09/03/2011 1440   CALCIUM 8.8 (L) 08/13/2018 1551   CALCIUM 9.0 09/03/2011 1440   PROT 7.4 08/13/2018 1551   PROT 7.7 09/03/2011 1440   ALBUMIN 4.3 08/13/2018 1551   ALBUMIN 4.3 09/03/2011 1440   AST 21 08/13/2018 1551   AST 13 (L) 09/03/2011 1440   ALT 53 (H) 08/13/2018 1551   ALT 17 09/03/2011 1440   ALKPHOS 70 08/13/2018 1551   ALKPHOS 35 (L) 09/03/2011 1440   BILITOT 0.4 08/13/2018 1551   BILITOT 0.8 09/03/2011 1440   GFRNONAA >60 08/13/2018 1551   GFRNONAA >60 09/03/2011 1440   GFRAA >60 08/13/2018 1551   GFRAA >60 09/03/2011 1440     Assessment and Plan: 1. Normocytic anemia   2. Liver hemangioma   3. Fatigue, unspecified type   4. Elevated alanine aminotransferase (ALT) level    #Patient's labs showed mild anemia, normocytic.  Patient reports always having heavy menstrual..  And has been taking over-the-counter iron pills. Discussed with patient that I would recommend checking an official iron panel including TIBC, iron, ferritin.  Would also check folate and B12 level. If she is iron deficient, I would recommend starting oral iron supplementation and repeat labs in the future.  #Liver lesion, ultrasound and MRI images were independently reviewed by me and discussed with patient. 3 small benign liver lesions likely hemangioma.  I will hold additional images surveillance.  Discussed with patient.  #Chemistry labs show slightly elevated ALT.  Likely due to chronic alcohol use.  Hepatitis B and C were negative.   Alcohol  cessation discussed with patient. Repeat chemistry labs in 6 months.  Follow Up Instructions: Lab encounter this week for testing iron panel.  B12 and folate. Follow-up in the clinic in 6 months with lab and MD assessment.   I discussed the assessment and treatment plan with the patient. The patient was provided an opportunity to ask questions and all were answered. The patient agreed with the plan and demonstrated an understanding of the instructions.   The patient was advised to call back or seek an in-person evaluation if the symptoms worsen or if the condition fails to improve as anticipated.  I provided 18 minutes of non-face-to-face time during this encounter.   Earlie Server, MD

## 2018-09-10 NOTE — Progress Notes (Signed)
Called pt for follow up call via televisit. Patient recently dx with carpal tunnel

## 2018-09-12 ENCOUNTER — Other Ambulatory Visit: Payer: Commercial Managed Care - PPO

## 2018-09-16 ENCOUNTER — Inpatient Hospital Stay: Payer: Commercial Managed Care - PPO | Attending: Oncology

## 2018-09-16 ENCOUNTER — Other Ambulatory Visit: Payer: Self-pay

## 2018-09-16 DIAGNOSIS — R740 Nonspecific elevation of levels of transaminase and lactic acid dehydrogenase [LDH]: Secondary | ICD-10-CM | POA: Insufficient documentation

## 2018-09-16 DIAGNOSIS — D1803 Hemangioma of intra-abdominal structures: Secondary | ICD-10-CM | POA: Diagnosis not present

## 2018-09-16 DIAGNOSIS — D649 Anemia, unspecified: Secondary | ICD-10-CM

## 2018-09-16 DIAGNOSIS — R7401 Elevation of levels of liver transaminase levels: Secondary | ICD-10-CM

## 2018-09-16 DIAGNOSIS — R5383 Other fatigue: Secondary | ICD-10-CM | POA: Diagnosis not present

## 2018-09-16 LAB — VITAMIN B12: Vitamin B-12: 314 pg/mL (ref 180–914)

## 2018-09-16 LAB — FOLATE: Folate: 13.7 ng/mL (ref >5.9)

## 2018-09-16 LAB — IRON AND TIBC
Iron: 41 ug/dL (ref 28–170)
Saturation Ratios: 12 % (ref 10.4–31.8)
TIBC: 339 ug/dL (ref 250–450)
UIBC: 298 ug/dL

## 2018-09-16 LAB — FERRITIN: Ferritin: 14 ng/mL (ref 11–307)

## 2018-10-04 ENCOUNTER — Ambulatory Visit: Payer: Commercial Managed Care - PPO

## 2018-10-07 ENCOUNTER — Ambulatory Visit: Payer: Commercial Managed Care - PPO | Admitting: Oncology

## 2019-03-12 ENCOUNTER — Inpatient Hospital Stay: Payer: Commercial Managed Care - PPO | Admitting: Oncology

## 2019-03-12 ENCOUNTER — Inpatient Hospital Stay: Payer: Commercial Managed Care - PPO

## 2019-03-24 ENCOUNTER — Inpatient Hospital Stay: Payer: Commercial Managed Care - PPO | Admitting: Oncology

## 2019-03-24 ENCOUNTER — Inpatient Hospital Stay: Payer: Commercial Managed Care - PPO

## 2019-03-25 ENCOUNTER — Other Ambulatory Visit: Payer: Self-pay

## 2019-03-25 DIAGNOSIS — Z20822 Contact with and (suspected) exposure to covid-19: Secondary | ICD-10-CM

## 2019-03-27 LAB — NOVEL CORONAVIRUS, NAA: SARS-CoV-2, NAA: NOT DETECTED

## 2019-04-04 ENCOUNTER — Inpatient Hospital Stay: Payer: Commercial Managed Care - PPO | Admitting: Oncology

## 2019-04-04 ENCOUNTER — Inpatient Hospital Stay: Payer: Commercial Managed Care - PPO

## 2019-04-10 ENCOUNTER — Encounter: Payer: Self-pay | Admitting: Oncology

## 2019-04-10 ENCOUNTER — Other Ambulatory Visit: Payer: Self-pay

## 2019-04-10 NOTE — Progress Notes (Signed)
Patient prescreened for appointment. Patient has no concerns or questions.  

## 2019-04-11 ENCOUNTER — Inpatient Hospital Stay: Payer: Commercial Managed Care - PPO | Admitting: Oncology

## 2019-04-11 ENCOUNTER — Inpatient Hospital Stay: Payer: Commercial Managed Care - PPO

## 2019-04-11 ENCOUNTER — Telehealth: Payer: Self-pay | Admitting: *Deleted

## 2019-04-11 NOTE — Telephone Encounter (Signed)
Pt has been No Show for the last past 4 appts. A letter will be mailed out for her to give the office a call back to R/S.

## 2019-04-14 ENCOUNTER — Encounter: Payer: Self-pay | Admitting: *Deleted

## 2020-10-16 IMAGING — US US ABDOMEN COMPLETE
1 series · 13 of 25 positions shown · non-contrast
Comparison: CT Abdomen and Pelvis 09/03/2011 and earlier.

CLINICAL DATA: 37-year-old female with liver lesion on outside
Spine MRI.

EXAM:
ABDOMEN ULTRASOUND COMPLETE

[Series 1: us abdomen complete · 13 of 113 slices shown]
[im 1/113]
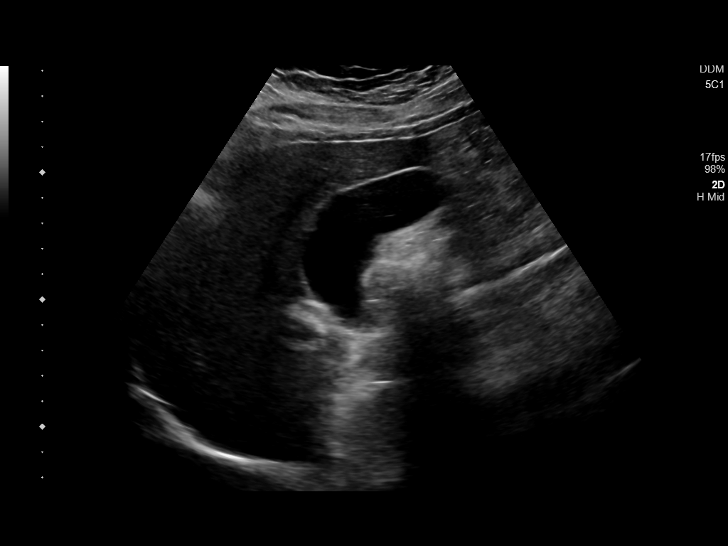
[im 10/113]
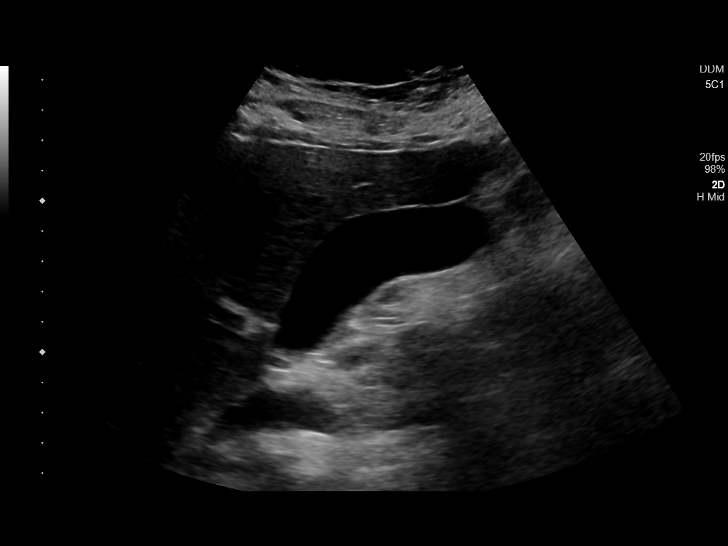
[im 19/113]
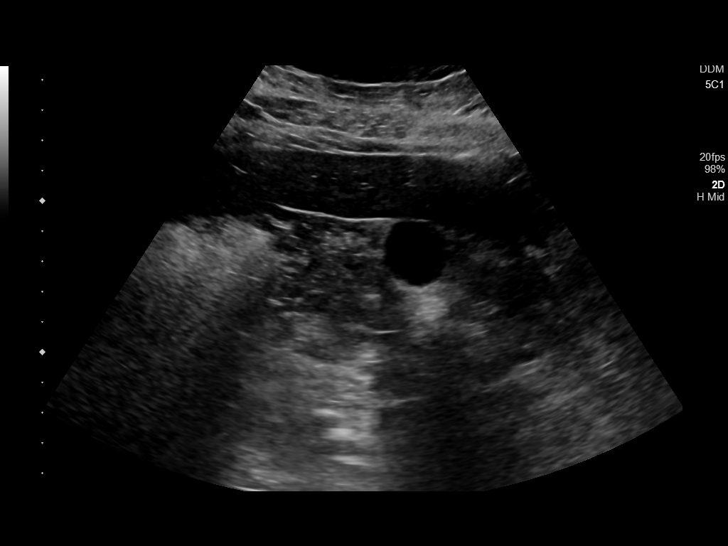
[im 29/113]
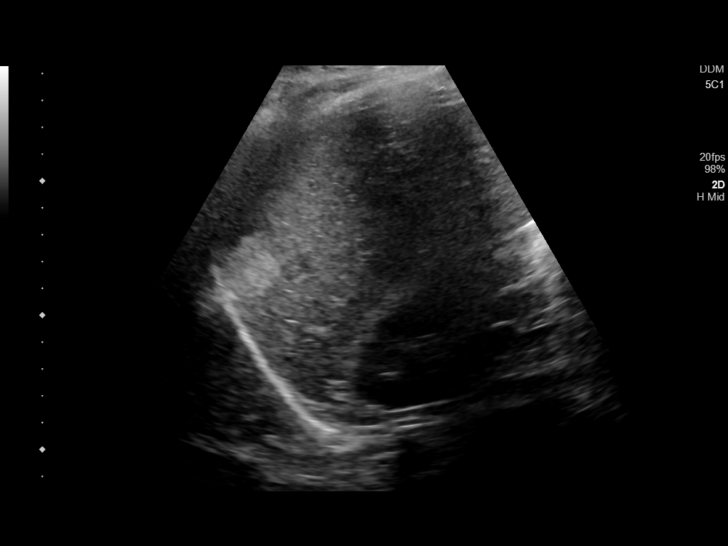
[im 38/113]
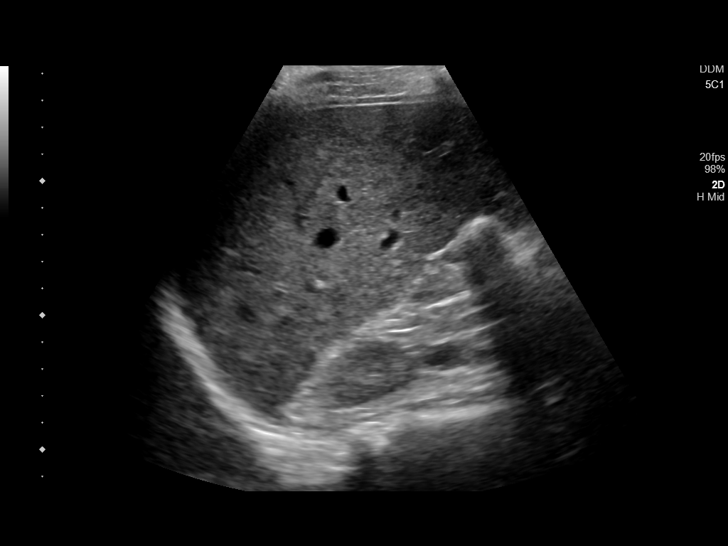
[im 47/113]
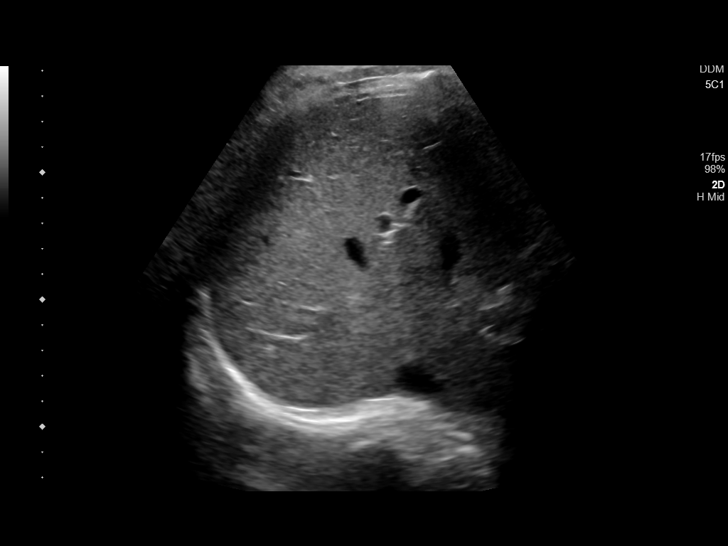
[im 57/113]
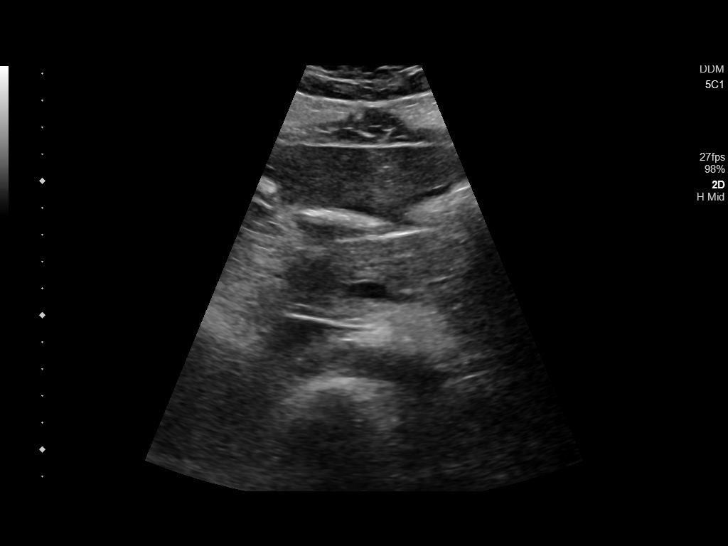
[im 66/113]
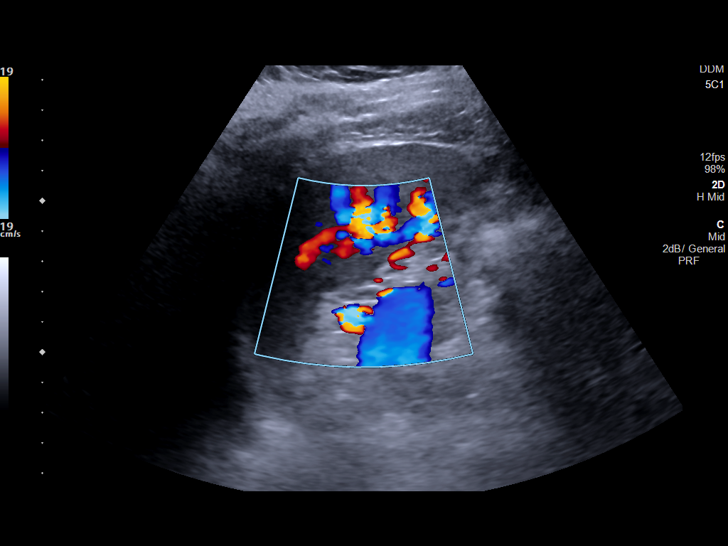
[im 75/113]
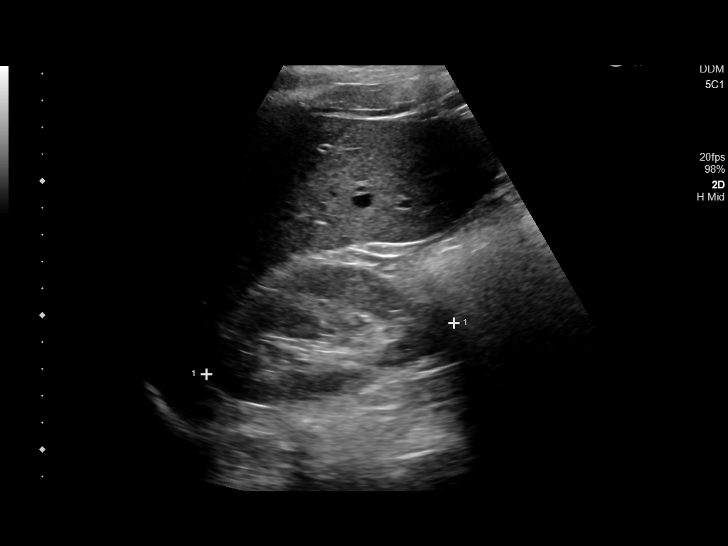
[im 85/113]
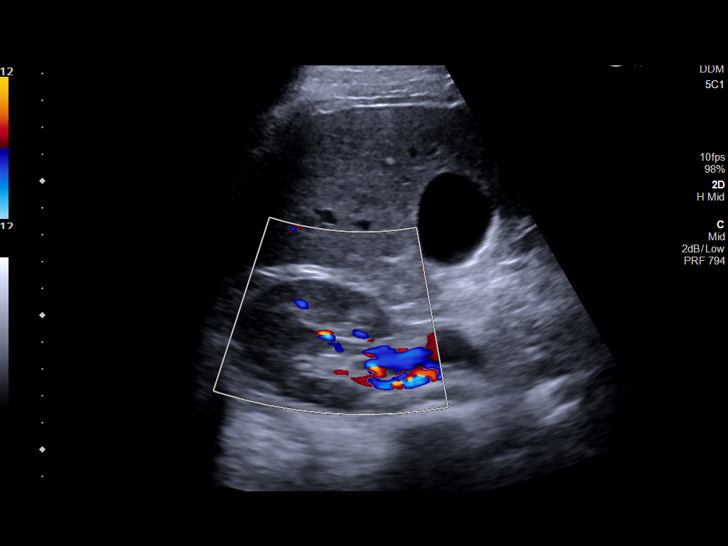
[im 94/113]
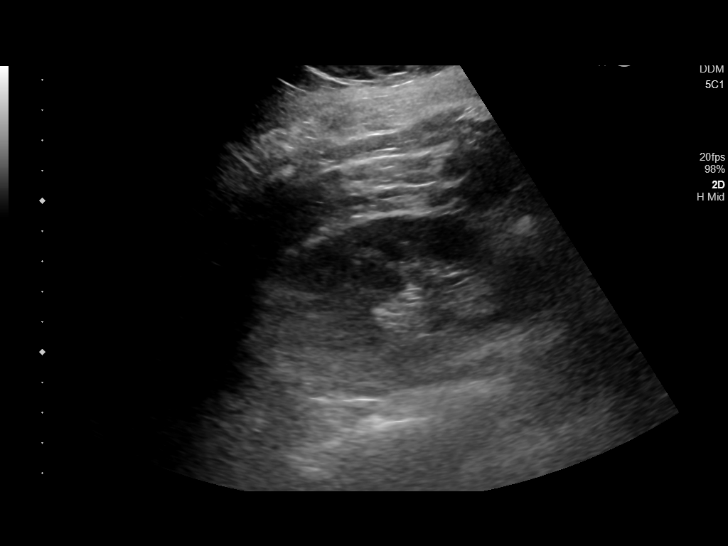
[im 103/113]
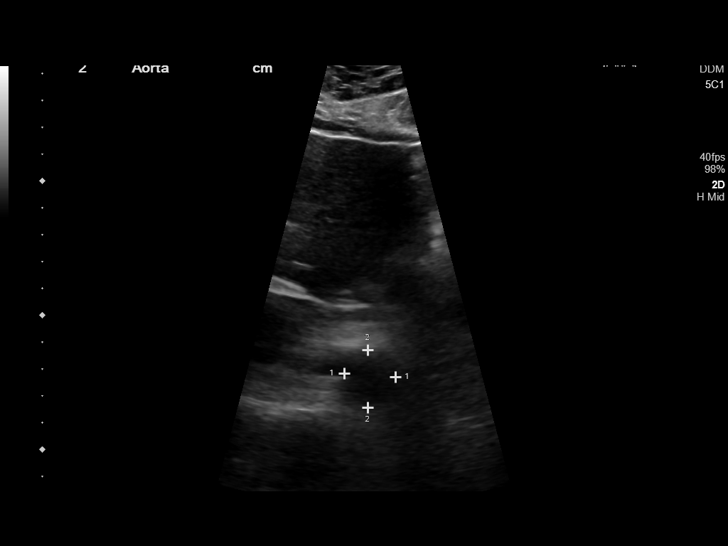
[im 113/113]
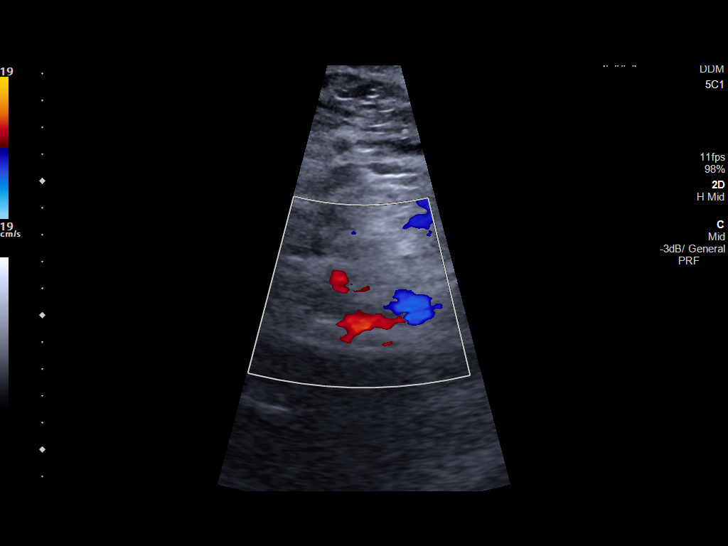

[13 of 25 positions shown; findings below may reference images not displayed]

FINDINGS: Gallbladder: No gallstones or wall thickening visualized. No
sonographic Murphy sign noted by sonographer.

Common bile duct: Diameter: 4 millimeters, normal.

Liver: Background liver echogenicity is within normal limits. There
are 3 small round hyperechoic areas identified in the liver. The
largest is in lateral right lobe (image 32) measuring 22 millimeters
diameter. A 12 millimeter lesion is seen in the inferior right lobe
(image 37), and a subtle 8 millimeter lesion is seen in the left
lobe (image 29). All 3 most resemble benign hemangiomas. And on the
2884 MRI there was a round 14 millimeter lesion in the lateral right
lobe which likely corresponds. No intrahepatic biliary ductal
dilatation. Portal vein is patent on color Doppler imaging with
normal direction of blood flow towards the liver.

IVC: No abnormality visualized.

Pancreas: Visualized portion unremarkable.

Spleen: Size and appearance within normal limits.

Right Kidney: Length: 9.4 centimeters. Echogenicity within normal
limits. No mass or hydronephrosis visualized.

Left Kidney: Length: 9.3 centimeters. Echogenicity within normal
limits. No mass or hydronephrosis visualized.

Abdominal aorta: No aneurysm visualized.

Other findings: None.
IMPRESSION: 1. Three small benign liver hemangiomas are identified, the largest
measuring 22 millimeters diameter in the lateral right lobe.
2. Negative ultrasound appearance of the abdomen.

## 2021-11-11 ENCOUNTER — Encounter: Payer: Self-pay | Admitting: Emergency Medicine

## 2021-11-11 ENCOUNTER — Emergency Department
Admission: EM | Admit: 2021-11-11 | Discharge: 2021-11-11 | Disposition: A | Discharge status: Home / Self Care | Payer: BC Managed Care – PPO | Attending: Emergency Medicine | Admitting: Emergency Medicine

## 2021-11-11 ENCOUNTER — Other Ambulatory Visit: Payer: Self-pay

## 2021-11-11 DIAGNOSIS — S161XXA Strain of muscle, fascia and tendon at neck level, initial encounter: Secondary | ICD-10-CM | POA: Insufficient documentation

## 2021-11-11 DIAGNOSIS — M25531 Pain in right wrist: Secondary | ICD-10-CM | POA: Insufficient documentation

## 2021-11-11 DIAGNOSIS — Y92410 Unspecified street and highway as the place of occurrence of the external cause: Secondary | ICD-10-CM | POA: Insufficient documentation

## 2021-11-11 DIAGNOSIS — I10 Essential (primary) hypertension: Secondary | ICD-10-CM | POA: Insufficient documentation

## 2021-11-11 DIAGNOSIS — M549 Dorsalgia, unspecified: Secondary | ICD-10-CM | POA: Insufficient documentation

## 2021-11-11 DIAGNOSIS — S199XXA Unspecified injury of neck, initial encounter: Secondary | ICD-10-CM | POA: Diagnosis present

## 2021-11-11 MED ORDER — NAPROXEN 500 MG PO TABS: 500.0000 mg | ORAL_TABLET | Freq: Two times a day (BID) | ORAL | 2 refills | Status: AC
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
## 2022-10-31 ENCOUNTER — Other Ambulatory Visit: Payer: Self-pay | Admitting: Family Medicine

## 2022-10-31 DIAGNOSIS — Z1231 Encounter for screening mammogram for malignant neoplasm of breast: Secondary | ICD-10-CM

## 2022-11-08 ENCOUNTER — Ambulatory Visit
Admission: RE | Admit: 2022-11-08 | Discharge: 2022-11-08 | Disposition: A | Discharge status: Home / Self Care | Payer: BC Managed Care – PPO | Source: Ambulatory Visit | Attending: Family Medicine | Admitting: Family Medicine

## 2022-11-08 DIAGNOSIS — Z1231 Encounter for screening mammogram for malignant neoplasm of breast: Secondary | ICD-10-CM | POA: Insufficient documentation

## 2022-11-13 ENCOUNTER — Other Ambulatory Visit: Payer: Self-pay | Admitting: Family Medicine

## 2022-11-13 DIAGNOSIS — R928 Other abnormal and inconclusive findings on diagnostic imaging of breast: Secondary | ICD-10-CM

## 2022-11-13 DIAGNOSIS — N63 Unspecified lump in unspecified breast: Secondary | ICD-10-CM

## 2022-11-15 ENCOUNTER — Ambulatory Visit
Admission: RE | Admit: 2022-11-15 | Discharge: 2022-11-15 | Disposition: A | Discharge status: Home / Self Care | Payer: BC Managed Care – PPO | Source: Ambulatory Visit | Attending: Family Medicine | Admitting: Family Medicine

## 2022-11-15 DIAGNOSIS — N63 Unspecified lump in unspecified breast: Secondary | ICD-10-CM

## 2022-11-15 DIAGNOSIS — R928 Other abnormal and inconclusive findings on diagnostic imaging of breast: Secondary | ICD-10-CM | POA: Diagnosis present

## 2023-12-11 ENCOUNTER — Encounter: Payer: Self-pay | Admitting: Family Medicine

## 2023-12-18 ENCOUNTER — Other Ambulatory Visit: Payer: Self-pay | Admitting: Family Medicine

## 2023-12-18 DIAGNOSIS — Z1231 Encounter for screening mammogram for malignant neoplasm of breast: Secondary | ICD-10-CM

## 2023-12-27 ENCOUNTER — Other Ambulatory Visit: Payer: Self-pay | Admitting: Family Medicine

## 2023-12-27 DIAGNOSIS — N6310 Unspecified lump in the right breast, unspecified quadrant: Secondary | ICD-10-CM

## 2024-01-01 ENCOUNTER — Encounter

## 2024-01-02 ENCOUNTER — Ambulatory Visit
Admission: RE | Admit: 2024-01-02 | Discharge: 2024-01-02 | Disposition: A | Discharge status: Home / Self Care | Source: Ambulatory Visit | Attending: Family Medicine | Admitting: Family Medicine

## 2024-01-02 DIAGNOSIS — N6310 Unspecified lump in the right breast, unspecified quadrant: Secondary | ICD-10-CM | POA: Diagnosis present

## 2024-01-02 DIAGNOSIS — N6320 Unspecified lump in the left breast, unspecified quadrant: Secondary | ICD-10-CM | POA: Insufficient documentation
